# Patient Record
Sex: Female | Born: 1945 | Race: White | Hispanic: No | Marital: Married | Smoking: Never smoker
Health system: Southern US, Community
[De-identification: ages and names within clinical notes are randomized; demographics above are authoritative.]

## PROBLEM LIST (undated history)

## (undated) DIAGNOSIS — K219 Gastro-esophageal reflux disease without esophagitis: Secondary | ICD-10-CM

## (undated) DIAGNOSIS — I10 Essential (primary) hypertension: Secondary | ICD-10-CM

## (undated) DIAGNOSIS — M1990 Unspecified osteoarthritis, unspecified site: Secondary | ICD-10-CM

## (undated) DIAGNOSIS — Z8719 Personal history of other diseases of the digestive system: Secondary | ICD-10-CM

## (undated) HISTORY — PX: EYE SURGERY: SHX253

## (undated) HISTORY — PX: COLONOSCOPY: SHX174

## (undated) HISTORY — PX: ABDOMINAL HYSTERECTOMY: SHX81

## (undated) HISTORY — PX: ESOPHAGOGASTRODUODENOSCOPY: SHX1529

---

## 2004-04-02 ENCOUNTER — Ambulatory Visit: Payer: Self-pay | Admitting: Internal Medicine

## 2005-04-21 ENCOUNTER — Ambulatory Visit: Payer: Self-pay | Admitting: Internal Medicine

## 2005-11-25 ENCOUNTER — Ambulatory Visit: Payer: Self-pay | Admitting: Gastroenterology

## 2006-04-26 ENCOUNTER — Ambulatory Visit: Payer: Self-pay | Admitting: Internal Medicine

## 2007-05-04 ENCOUNTER — Ambulatory Visit: Payer: Self-pay | Admitting: Internal Medicine

## 2008-05-06 ENCOUNTER — Ambulatory Visit: Payer: Self-pay | Admitting: Internal Medicine

## 2009-05-08 ENCOUNTER — Ambulatory Visit: Payer: Self-pay | Admitting: Internal Medicine

## 2009-05-19 ENCOUNTER — Ambulatory Visit: Payer: Self-pay | Admitting: Internal Medicine

## 2010-05-20 ENCOUNTER — Ambulatory Visit: Payer: Self-pay | Admitting: Internal Medicine

## 2011-06-01 ENCOUNTER — Ambulatory Visit: Payer: Self-pay | Admitting: Internal Medicine

## 2012-07-19 ENCOUNTER — Ambulatory Visit: Payer: Self-pay | Admitting: Internal Medicine

## 2013-07-30 ENCOUNTER — Ambulatory Visit: Payer: Self-pay | Admitting: Family Medicine

## 2014-08-08 ENCOUNTER — Ambulatory Visit: Payer: Self-pay | Admitting: Family Medicine

## 2015-09-12 ENCOUNTER — Other Ambulatory Visit: Payer: Self-pay | Admitting: Family Medicine

## 2015-09-12 DIAGNOSIS — Z1231 Encounter for screening mammogram for malignant neoplasm of breast: Secondary | ICD-10-CM

## 2015-09-29 ENCOUNTER — Other Ambulatory Visit: Payer: Self-pay | Admitting: Family Medicine

## 2015-09-29 ENCOUNTER — Ambulatory Visit
Admission: RE | Admit: 2015-09-29 | Discharge: 2015-09-29 | Disposition: A | Discharge status: Home / Self Care | Payer: Medicare Other | Source: Ambulatory Visit | Attending: Family Medicine | Admitting: Family Medicine

## 2015-09-29 DIAGNOSIS — Z1231 Encounter for screening mammogram for malignant neoplasm of breast: Secondary | ICD-10-CM | POA: Insufficient documentation

## 2015-10-01 ENCOUNTER — Other Ambulatory Visit: Payer: Self-pay | Admitting: Family Medicine

## 2015-10-01 DIAGNOSIS — R928 Other abnormal and inconclusive findings on diagnostic imaging of breast: Secondary | ICD-10-CM

## 2015-10-17 ENCOUNTER — Ambulatory Visit
Admission: RE | Admit: 2015-10-17 | Discharge: 2015-10-17 | Disposition: A | Discharge status: Home / Self Care | Payer: Medicare Other | Source: Ambulatory Visit | Attending: Family Medicine | Admitting: Family Medicine

## 2015-10-17 DIAGNOSIS — N6001 Solitary cyst of right breast: Secondary | ICD-10-CM | POA: Insufficient documentation

## 2015-10-17 DIAGNOSIS — N63 Unspecified lump in breast: Secondary | ICD-10-CM | POA: Diagnosis present

## 2015-10-17 DIAGNOSIS — R928 Other abnormal and inconclusive findings on diagnostic imaging of breast: Secondary | ICD-10-CM

## 2016-08-30 ENCOUNTER — Encounter: Payer: Self-pay | Admitting: *Deleted

## 2016-08-31 ENCOUNTER — Ambulatory Visit: Payer: Medicare Other | Admitting: *Deleted

## 2016-08-31 ENCOUNTER — Ambulatory Visit
Admission: RE | Admit: 2016-08-31 | Discharge: 2016-08-31 | Disposition: A | Discharge status: Home / Self Care | Payer: Medicare Other | Source: Ambulatory Visit | Attending: Gastroenterology | Admitting: Gastroenterology

## 2016-08-31 ENCOUNTER — Encounter: Payer: Self-pay | Admitting: *Deleted

## 2016-08-31 ENCOUNTER — Encounter
Admission: RE | Disposition: A | Discharge status: Home / Self Care | Payer: Self-pay | Source: Ambulatory Visit | Attending: Gastroenterology

## 2016-08-31 DIAGNOSIS — Z1211 Encounter for screening for malignant neoplasm of colon: Secondary | ICD-10-CM | POA: Insufficient documentation

## 2016-08-31 DIAGNOSIS — Z79899 Other long term (current) drug therapy: Secondary | ICD-10-CM | POA: Insufficient documentation

## 2016-08-31 DIAGNOSIS — I10 Essential (primary) hypertension: Secondary | ICD-10-CM | POA: Diagnosis not present

## 2016-08-31 DIAGNOSIS — Z7982 Long term (current) use of aspirin: Secondary | ICD-10-CM | POA: Insufficient documentation

## 2016-08-31 DIAGNOSIS — K219 Gastro-esophageal reflux disease without esophagitis: Secondary | ICD-10-CM | POA: Diagnosis not present

## 2016-08-31 DIAGNOSIS — K5730 Diverticulosis of large intestine without perforation or abscess without bleeding: Secondary | ICD-10-CM | POA: Insufficient documentation

## 2016-08-31 DIAGNOSIS — K449 Diaphragmatic hernia without obstruction or gangrene: Secondary | ICD-10-CM | POA: Diagnosis not present

## 2016-08-31 DIAGNOSIS — M1810 Unilateral primary osteoarthritis of first carpometacarpal joint, unspecified hand: Secondary | ICD-10-CM | POA: Insufficient documentation

## 2016-08-31 HISTORY — DX: Gastro-esophageal reflux disease without esophagitis: K21.9

## 2016-08-31 HISTORY — DX: Personal history of other diseases of the digestive system: Z87.19

## 2016-08-31 HISTORY — DX: Essential (primary) hypertension: I10

## 2016-08-31 HISTORY — PX: COLONOSCOPY WITH PROPOFOL: SHX5780

## 2016-08-31 HISTORY — DX: Unspecified osteoarthritis, unspecified site: M19.90

## 2016-08-31 SURGERY — COLONOSCOPY WITH PROPOFOL
Anesthesia: General

## 2016-08-31 MED ORDER — PROPOFOL 500 MG/50ML IV EMUL
INTRAVENOUS | Status: DC | PRN
  Administered 2016-08-31: 50 ug/kg/min via INTRAVENOUS

## 2016-08-31 MED ORDER — SODIUM CHLORIDE 0.9 % IV SOLN
INTRAVENOUS | Status: DC
  Administered 2016-08-31: 1000 mL via INTRAVENOUS

## 2016-08-31 MED ORDER — SODIUM CHLORIDE 0.9 % IV SOLN: INTRAVENOUS | Status: DC

## 2016-08-31 MED ORDER — PROPOFOL 500 MG/50ML IV EMUL
INTRAVENOUS | Status: AC
  Filled 2016-08-31: qty 50

## 2016-08-31 NOTE — Anesthesia Post-op Follow-up Note (Cosign Needed)
Anesthesia QCDR form completed.        

## 2016-08-31 NOTE — H&P (Signed)
Outpatient short stay form Pre-procedure 08/31/2016 8:55 AM Lollie Sails MD  Primary Physician: Dr Derinda Late  Reason for visit:  Colonoscopy  History of present illness:  Patient is a 71 year old female presenting today as above. Her last colonoscopy was about 10 years ago. She tolerated her prep well. She takes no blood thinning agents. She does take an 81 mg aspirin but has held that this morning. She denies any other aspirin products. She does have some issues with firm stools and we have discussed use of products such as MiraLAX or Citrucel.    Current Facility-Administered Medications:  .  0.9 %  sodium chloride infusion, , Intravenous, Continuous, Lollie Sails, MD, Last Rate: 20 mL/hr at 08/31/16 0851, 1,000 mL at 08/31/16 0851 .  0.9 %  sodium chloride infusion, , Intravenous, Continuous, Lollie Sails, MD  Prescriptions Prior to Admission  Medication Sig Dispense Refill Last Dose  . aspirin EC 81 MG tablet Take 81 mg by mouth daily.     . calcium carbonate (OSCAL) 1500 (600 Ca) MG TABS tablet Take 1,500 mg by mouth 2 (two) times daily with a meal.     . losartan-hydrochlorothiazide (HYZAAR) 50-12.5 MG tablet Take 1 tablet by mouth daily.   08/31/2016 at 0300  . meloxicam (MOBIC) 7.5 MG tablet Take 7.5 mg by mouth daily.     . Misc Natural Products (GLUCOSAMINE-CHONDROITIN SULF) TABS Take by mouth daily.     Marland Kitchen omeprazole (PRILOSEC) 20 MG capsule Take 20 mg by mouth daily.     Marland Kitchen oxybutynin (DITROPAN) 5 MG tablet Take 5 mg by mouth 3 (three) times daily.     . polyethylene glycol powder (GLYCOLAX/MIRALAX) powder Take 1 Container by mouth once.     . triamcinolone (NASACORT AQ) 55 MCG/ACT AERO nasal inhaler Place 2 sprays into the nose daily.        Not on File   Past Medical History:  Diagnosis Date  . Arthritis    OSTEOARTHRITIS OF THUMB  . GERD (gastroesophageal reflux disease)   . History of hiatal hernia   . Hypertension     Review of systems:       Physical Exam    Heart and lungs: Regular rate and rhythm without rub or gallop, lungs are bilaterally clear.    HEENT: Normocephalic atraumatic eyes are anicteric    Other:     Pertinant exam for procedure: Soft nontender nondistended bowel sounds positive normoactive.    Planned proceedures: Colonoscopy and indicated procedures. I have discussed the risks benefits and complications of procedures to include not limited to bleeding, infection, perforation and the risk of sedation and the patient wishes to proceed.  Lollie Sails, MD Gastroenterology

## 2016-08-31 NOTE — Anesthesia Postprocedure Evaluation (Signed)
Anesthesia Post Note  Patient: [name]  Procedure(s) Performed: Procedure(s) (LRB): COLONOSCOPY WITH PROPOFOL (N/A)  Patient location during evaluation: Endoscopy Anesthesia Type: General Level of consciousness: awake and alert and oriented Pain management: pain level controlled Vital Signs Assessment: post-procedure vital signs reviewed and stable Respiratory status: spontaneous breathing, nonlabored ventilation and respiratory function stable Cardiovascular status: blood pressure returned to baseline and stable Postop Assessment: no signs of nausea or vomiting Anesthetic complications: no     Last Vitals:  Vitals:   08/31/16 0947 08/31/16 0957  BP: 109/68 126/71  Pulse: 60 (!) 55  Resp: 17 11  Temp:      Last Pain:  Vitals:   08/31/16 0927  TempSrc: Tympanic                 Keeshia Sanderlin

## 2016-08-31 NOTE — Transfer of Care (Signed)
Immediate Anesthesia Transfer of Care Note  Patient: Taylor Acevedo  Procedure(s) Performed: Procedure(s): COLONOSCOPY WITH PROPOFOL (N/A)  Patient Location: PACU  Anesthesia Type:General  Level of Consciousness: awake, alert  and oriented  Airway & Oxygen Therapy: Patient Spontanous Breathing and Patient connected to nasal cannula oxygen  Post-op Assessment: Report given to RN and Post -op Vital signs reviewed and stable  Post vital signs: Reviewed and stable  Last Vitals:  Vitals:   08/31/16 0835  BP: 125/68  Pulse: 68  Resp: 18  Temp: (!) 35.9 C    Last Pain: There were no vitals filed for this visit.       Complications: No apparent anesthesia complications

## 2016-08-31 NOTE — Anesthesia Preprocedure Evaluation (Signed)
Anesthesia Evaluation  Patient identified by MRN, date of birth, ID band Patient awake    Reviewed: Allergy & Precautions, NPO status , Patient's Chart, lab work & pertinent test results  History of Anesthesia Complications Negative for: history of anesthetic complications  Airway Mallampati: II  TM Distance: >3 FB Neck ROM: Full    Dental no notable dental hx.    Pulmonary neg pulmonary ROS, neg sleep apnea, neg COPD,    breath sounds clear to auscultation- rhonchi (-) wheezing      Cardiovascular hypertension, Pt. on medications (-) CAD and (-) Past MI  Rhythm:Regular Rate:Normal - Systolic murmurs and - Diastolic murmurs    Neuro/Psych negative neurological ROS  negative psych ROS   GI/Hepatic Neg liver ROS, hiatal hernia, GERD  ,  Endo/Other  negative endocrine ROSneg diabetes  Renal/GU negative Renal ROS     Musculoskeletal  (+) Arthritis ,   Abdominal (+) - obese,   Peds  Hematology negative hematology ROS (+)   Anesthesia Other Findings Past Medical History: No date: Arthritis     Comment: OSTEOARTHRITIS OF THUMB No date: GERD (gastroesophageal reflux disease) No date: History of hiatal hernia No date: Hypertension   Reproductive/Obstetrics                             Anesthesia Physical Anesthesia Plan  ASA: II  Anesthesia Plan: General   Post-op Pain Management:    Induction: Intravenous  Airway Management Planned: Natural Airway  Additional Equipment:   Intra-op Plan:   Post-operative Plan:   Informed Consent: I have reviewed the patients History and Physical, chart, labs and discussed the procedure including the risks, benefits and alternatives for the proposed anesthesia with the patient or authorized representative who has indicated his/her understanding and acceptance.   Dental advisory given  Plan Discussed with: CRNA and Anesthesiologist  Anesthesia  Plan Comments:         Anesthesia Quick Evaluation

## 2016-08-31 NOTE — Op Note (Signed)
[facility] Gastroenterology Patient Name: Taylor Acevedo Procedure Date: 08/31/2016 8:57 AM MRN: [id number] Account #: [id number] Date of Birth: [birth year] Admit Type: Outpatient Age: 70 Room: [facility] ENDO ROOM 3 Gender: Female Note Status: Finalized Procedure:            Colonoscopy Indications:          Screening for colorectal malignant neoplasm Providers:            Lollie Sails, MD Referring MD:         Caprice Renshaw MD (Referring MD) Medicines:            Monitored Anesthesia Care Complications:        No immediate complications. Procedure:            Pre-Anesthesia Assessment:                       - ASA Grade Assessment: II - A patient with mild                        systemic disease.                       After obtaining informed consent, the colonoscope was                        passed under direct vision. Throughout the procedure,                        the patient's blood pressure, pulse, and oxygen                        saturations were monitored continuously. The                        Colonoscope was introduced through the anus and                        advanced to the the cecum, identified by appendiceal                        orifice and ileocecal valve. The patient tolerated the                        procedure well. The quality of the bowel preparation                        was good. Findings:      A few small-mouthed diverticula were found in the sigmoid colon.      The digital rectal exam was normal.      The exam was otherwise without abnormality. Impression:           - Diverticulosis in the sigmoid colon.                       - The examination was otherwise normal.                       - No specimens collected. Recommendation:       - Discharge patient to home. Procedure Code(s):    --- Professional ---  45378, Colonoscopy, flexible; diagnostic, including                        collection of  specimen(s) by brushing or washing, when                        performed (separate procedure) Diagnosis Code(s):    --- Professional ---                       Z12.11, Encounter for screening for malignant neoplasm                        of colon                       K57.30, Diverticulosis of large intestine without                        perforation or abscess without bleeding CPT copyright 2016 American [employer]. All rights reserved. The codes documented in this report are preliminary and upon coder review may  be revised to meet current compliance requirements. Lollie Sails, MD 08/31/2016 9:29:20 AM This report has been signed electronically. Number of Addenda: 0 Note Initiated On: 08/31/2016 8:57 AM Scope Withdrawal Time: 0 hours 6 minutes 16 seconds  Total Procedure Duration: 0 hours 17 minutes 59 seconds       [facility]

## 2017-01-25 ENCOUNTER — Other Ambulatory Visit: Payer: Self-pay | Admitting: Family Medicine

## 2017-01-25 DIAGNOSIS — Z1231 Encounter for screening mammogram for malignant neoplasm of breast: Secondary | ICD-10-CM

## 2017-02-02 ENCOUNTER — Ambulatory Visit
Admission: RE | Admit: 2017-02-02 | Discharge: 2017-02-02 | Disposition: A | Discharge status: Home / Self Care | Payer: Medicare Other | Source: Ambulatory Visit | Attending: Family Medicine | Admitting: Family Medicine

## 2017-02-02 DIAGNOSIS — Z1231 Encounter for screening mammogram for malignant neoplasm of breast: Secondary | ICD-10-CM

## 2017-02-08 ENCOUNTER — Other Ambulatory Visit: Payer: Self-pay | Admitting: Family Medicine

## 2017-02-08 DIAGNOSIS — R928 Other abnormal and inconclusive findings on diagnostic imaging of breast: Secondary | ICD-10-CM

## 2017-02-08 DIAGNOSIS — N6310 Unspecified lump in the right breast, unspecified quadrant: Secondary | ICD-10-CM

## 2017-02-15 ENCOUNTER — Ambulatory Visit
Admission: RE | Admit: 2017-02-15 | Discharge: 2017-02-15 | Disposition: A | Discharge status: Home / Self Care | Payer: Medicare Other | Source: Ambulatory Visit | Attending: Family Medicine | Admitting: Family Medicine

## 2017-02-15 DIAGNOSIS — R928 Other abnormal and inconclusive findings on diagnostic imaging of breast: Secondary | ICD-10-CM

## 2017-02-15 DIAGNOSIS — N6310 Unspecified lump in the right breast, unspecified quadrant: Secondary | ICD-10-CM

## 2017-02-15 DIAGNOSIS — N6312 Unspecified lump in the right breast, upper inner quadrant: Secondary | ICD-10-CM | POA: Diagnosis not present

## 2017-07-25 ENCOUNTER — Other Ambulatory Visit: Payer: Self-pay | Admitting: Family Medicine

## 2017-07-25 DIAGNOSIS — N6310 Unspecified lump in the right breast, unspecified quadrant: Secondary | ICD-10-CM

## 2017-08-16 ENCOUNTER — Other Ambulatory Visit: Payer: Medicare Other

## 2017-08-26 ENCOUNTER — Ambulatory Visit
Admission: RE | Admit: 2017-08-26 | Discharge: 2017-08-26 | Disposition: A | Discharge status: Home / Self Care | Payer: Medicare Other | Source: Ambulatory Visit | Attending: Family Medicine | Admitting: Family Medicine

## 2017-08-26 DIAGNOSIS — N6314 Unspecified lump in the right breast, lower inner quadrant: Secondary | ICD-10-CM | POA: Insufficient documentation

## 2017-08-26 DIAGNOSIS — N6313 Unspecified lump in the right breast, lower outer quadrant: Secondary | ICD-10-CM | POA: Insufficient documentation

## 2017-08-26 DIAGNOSIS — N6310 Unspecified lump in the right breast, unspecified quadrant: Secondary | ICD-10-CM | POA: Diagnosis present

## 2018-01-25 ENCOUNTER — Other Ambulatory Visit: Payer: Self-pay | Admitting: Family Medicine

## 2018-01-25 DIAGNOSIS — R928 Other abnormal and inconclusive findings on diagnostic imaging of breast: Secondary | ICD-10-CM

## 2018-02-27 ENCOUNTER — Ambulatory Visit
Admission: RE | Admit: 2018-02-27 | Discharge: 2018-02-27 | Disposition: A | Discharge status: Home / Self Care | Payer: Medicare Other | Source: Ambulatory Visit | Attending: Family Medicine | Admitting: Family Medicine

## 2018-02-27 DIAGNOSIS — R928 Other abnormal and inconclusive findings on diagnostic imaging of breast: Secondary | ICD-10-CM | POA: Diagnosis not present

## 2018-06-21 HISTORY — PX: EYE SURGERY: SHX253

## 2019-03-29 ENCOUNTER — Other Ambulatory Visit: Payer: Self-pay | Admitting: Family Medicine

## 2019-03-29 DIAGNOSIS — R928 Other abnormal and inconclusive findings on diagnostic imaging of breast: Secondary | ICD-10-CM

## 2019-03-30 ENCOUNTER — Other Ambulatory Visit: Payer: Self-pay | Admitting: Family Medicine

## 2019-03-30 DIAGNOSIS — R928 Other abnormal and inconclusive findings on diagnostic imaging of breast: Secondary | ICD-10-CM

## 2019-04-24 ENCOUNTER — Ambulatory Visit
Admission: RE | Admit: 2019-04-24 | Discharge: 2019-04-24 | Disposition: A | Discharge status: Home / Self Care | Payer: Medicare Other | Source: Ambulatory Visit | Attending: Family Medicine | Admitting: Family Medicine

## 2019-04-24 DIAGNOSIS — R928 Other abnormal and inconclusive findings on diagnostic imaging of breast: Secondary | ICD-10-CM

## 2020-02-28 ENCOUNTER — Other Ambulatory Visit: Payer: Self-pay | Admitting: Orthopedic Surgery

## 2020-02-28 DIAGNOSIS — M25561 Pain in right knee: Secondary | ICD-10-CM

## 2020-03-02 ENCOUNTER — Other Ambulatory Visit: Payer: Self-pay

## 2020-03-02 ENCOUNTER — Ambulatory Visit
Admission: RE | Admit: 2020-03-02 | Discharge: 2020-03-02 | Disposition: A | Discharge status: Home / Self Care | Payer: Medicare Other | Source: Ambulatory Visit | Attending: Orthopedic Surgery | Admitting: Orthopedic Surgery

## 2020-03-02 DIAGNOSIS — M25561 Pain in right knee: Secondary | ICD-10-CM | POA: Insufficient documentation

## 2020-05-21 ENCOUNTER — Other Ambulatory Visit: Payer: Self-pay | Admitting: Family Medicine

## 2020-05-21 DIAGNOSIS — Z1231 Encounter for screening mammogram for malignant neoplasm of breast: Secondary | ICD-10-CM

## 2020-05-26 ENCOUNTER — Other Ambulatory Visit: Payer: Self-pay

## 2020-05-26 ENCOUNTER — Ambulatory Visit
Admission: RE | Admit: 2020-05-26 | Discharge: 2020-05-26 | Disposition: A | Discharge status: Home / Self Care | Payer: Medicare Other | Source: Ambulatory Visit | Attending: Family Medicine | Admitting: Family Medicine

## 2020-05-26 DIAGNOSIS — Z1231 Encounter for screening mammogram for malignant neoplasm of breast: Secondary | ICD-10-CM | POA: Diagnosis present

## 2020-05-28 ENCOUNTER — Other Ambulatory Visit (HOSPITAL_COMMUNITY): Payer: Self-pay | Admitting: Orthopedic Surgery

## 2020-05-28 ENCOUNTER — Other Ambulatory Visit: Payer: Self-pay | Admitting: Orthopedic Surgery

## 2020-05-28 DIAGNOSIS — R29898 Other symptoms and signs involving the musculoskeletal system: Secondary | ICD-10-CM

## 2020-05-28 DIAGNOSIS — S46011A Strain of muscle(s) and tendon(s) of the rotator cuff of right shoulder, initial encounter: Secondary | ICD-10-CM

## 2020-06-06 ENCOUNTER — Ambulatory Visit
Admission: RE | Admit: 2020-06-06 | Discharge: 2020-06-06 | Disposition: A | Discharge status: Home / Self Care | Payer: Medicare Other | Source: Ambulatory Visit | Attending: Orthopedic Surgery | Admitting: Orthopedic Surgery

## 2020-06-06 ENCOUNTER — Other Ambulatory Visit: Payer: Self-pay

## 2020-06-06 DIAGNOSIS — R29898 Other symptoms and signs involving the musculoskeletal system: Secondary | ICD-10-CM

## 2020-06-06 DIAGNOSIS — S46011A Strain of muscle(s) and tendon(s) of the rotator cuff of right shoulder, initial encounter: Secondary | ICD-10-CM | POA: Diagnosis present

## 2020-09-08 ENCOUNTER — Other Ambulatory Visit: Payer: Self-pay | Admitting: Orthopedic Surgery

## 2020-09-23 ENCOUNTER — Other Ambulatory Visit: Payer: Self-pay

## 2020-09-23 ENCOUNTER — Other Ambulatory Visit
Admission: RE | Admit: 2020-09-23 | Discharge: 2020-09-23 | Disposition: A | Discharge status: Home / Self Care | Payer: Medicare Other | Source: Ambulatory Visit | Attending: Orthopedic Surgery | Admitting: Orthopedic Surgery

## 2020-09-23 NOTE — Patient Instructions (Signed)
Your procedure is scheduled on: Tuesday September 30, 2020. Report to Day Surgery inside [place] 2nd floor (Stop by admissions desk first before getting on elevator). To find out your arrival time please call [phone] between 1PM - 3PM on Monday September 29, 2020.  Remember: Instructions that are not followed completely may result in serious medical risk,  up to and including death, or upon the discretion of your surgeon and anesthesiologist your  surgery may need to be rescheduled.     _X__ 1. Do not eat food after midnight the night before your procedure.                 No chewing gum or hard candies. You may drink clear liquids up to 2 hours                 before you are scheduled to arrive for your surgery- DO not drink clear                 liquids within 2 hours of the start of your surgery.                 Clear Liquids include:  water, apple juice without pulp, clear Gatorade, G2 or                  Gatorade Zero (avoid Red/Purple/Blue), Black Coffee or Tea (Do not add                 anything to coffee or tea).  __X__2.   Complete the "Ensure Clear Pre-surgery Clear Carbohydrate Drink" provided to you, 2 hours before arrival. **If you are diabetic you will be provided with an alternative drink, Gatorade Zero or G2.  __X__3.  On the morning of surgery brush your teeth with toothpaste and water, you                may rinse your mouth with mouthwash if you wish.  Do not swallow any toothpaste of mouthwash.     _X__ 4.  No Alcohol for 24 hours before or after surgery.   _X__ 5.  Do Not Smoke or use e-cigarettes For 24 Hours Prior to Your Surgery.                 Do not use any chewable tobacco products for at least 6 hours prior to                 Surgery.  _X__ 6.  Do not use any recreational drugs (marijuana, cocaine, heroin, ecstasy, MDMA or other)                For at least one week prior to your surgery.  Combination of these drugs with  anesthesia                May have life threatening results..   __X_7.  Notify your doctor if there is any change in your medical condition      (cold, fever, infections).     Do not wear jewelry, make-up, hairpins, clips or nail polish. Do not wear lotions, powders, or perfumes. You may wear deodorant. Do not shave 48 hours prior to surgery.  Do not bring valuables to the hospital.    [facility] is not responsible for any belongings or valuables.  Contacts, dentures or bridgework may not be worn into surgery. Leave your suitcase in the car. After surgery it may be brought to your  room. For patients admitted to the hospital, discharge time is determined by your treatment team.   Patients discharged the day of surgery will not be allowed to drive home.   Make arrangements for someone to be with you for the first 24 hours of your Same Day Discharge.   __X__ Take these medicines the morning of surgery with A SIP OF WATER:    1. omeprazole (PRILOSEC) 20 MG   2. acetaminophen (TYLENOL) 650 MG   3.   4.  5.  6.  ____ Fleet Enema (as directed)   __X__ Use CHG Soap (or wipes) as directed  ____ Use Benzoyl Peroxide Gel as instructed  ____ Use inhalers on the day of surgery  ____ Stop metformin 2 days prior to surgery    ____ Take 1/2 of usual insulin dose the night before surgery. No insulin the morning          of surgery.   __X__ Stop aspirin EC 81 MG as instructed.   __X__ Stop Anti-inflammatories such as etodolac (LODINE), Ibuprofen, Aleve, Advil, Motrin, naproxen and or BC powders.    __X__ Stop supplements until after surgery.    __X__Do not start any herbal supplements before your procedure.    If you have any questions regarding your pre-procedure instructions,  Please call Pre-admit Testing at [phone].

## 2020-09-26 ENCOUNTER — Other Ambulatory Visit: Payer: Medicare Other | Attending: Orthopedic Surgery

## 2020-09-26 ENCOUNTER — Other Ambulatory Visit
Admission: RE | Admit: 2020-09-26 | Discharge: 2020-09-26 | Disposition: A | Discharge status: Home / Self Care | Payer: Medicare Other | Source: Ambulatory Visit | Attending: Orthopedic Surgery | Admitting: Orthopedic Surgery

## 2020-09-26 ENCOUNTER — Other Ambulatory Visit: Payer: Self-pay

## 2020-09-26 DIAGNOSIS — I10 Essential (primary) hypertension: Secondary | ICD-10-CM | POA: Diagnosis not present

## 2020-09-26 DIAGNOSIS — Z01818 Encounter for other preprocedural examination: Secondary | ICD-10-CM | POA: Diagnosis not present

## 2020-09-26 DIAGNOSIS — Z20822 Contact with and (suspected) exposure to covid-19: Secondary | ICD-10-CM | POA: Diagnosis not present

## 2020-09-26 LAB — SARS CORONAVIRUS 2 (TAT 6-24 HRS): SARS Coronavirus 2: NEGATIVE

## 2020-09-30 ENCOUNTER — Ambulatory Visit: Payer: Medicare Other | Admitting: Anesthesiology

## 2020-09-30 ENCOUNTER — Encounter: Payer: Self-pay | Admitting: Orthopedic Surgery

## 2020-09-30 ENCOUNTER — Ambulatory Visit
Admission: RE | Admit: 2020-09-30 | Discharge: 2020-09-30 | Disposition: A | Discharge status: Home / Self Care | Payer: Medicare Other | Attending: Orthopedic Surgery | Admitting: Orthopedic Surgery

## 2020-09-30 ENCOUNTER — Encounter
Admission: RE | Disposition: A | Discharge status: Home / Self Care | Payer: Self-pay | Source: Home / Self Care | Attending: Orthopedic Surgery

## 2020-09-30 DIAGNOSIS — Z7982 Long term (current) use of aspirin: Secondary | ICD-10-CM | POA: Insufficient documentation

## 2020-09-30 DIAGNOSIS — S83012A Lateral subluxation of left patella, initial encounter: Secondary | ICD-10-CM | POA: Insufficient documentation

## 2020-09-30 DIAGNOSIS — X58XXXA Exposure to other specified factors, initial encounter: Secondary | ICD-10-CM | POA: Diagnosis not present

## 2020-09-30 DIAGNOSIS — Z888 Allergy status to other drugs, medicaments and biological substances status: Secondary | ICD-10-CM | POA: Insufficient documentation

## 2020-09-30 DIAGNOSIS — M23241 Derangement of anterior horn of lateral meniscus due to old tear or injury, right knee: Secondary | ICD-10-CM | POA: Diagnosis not present

## 2020-09-30 DIAGNOSIS — M1711 Unilateral primary osteoarthritis, right knee: Secondary | ICD-10-CM | POA: Insufficient documentation

## 2020-09-30 DIAGNOSIS — M6751 Plica syndrome, right knee: Secondary | ICD-10-CM | POA: Insufficient documentation

## 2020-09-30 DIAGNOSIS — Z791 Long term (current) use of non-steroidal anti-inflammatories (NSAID): Secondary | ICD-10-CM | POA: Insufficient documentation

## 2020-09-30 DIAGNOSIS — Z79899 Other long term (current) drug therapy: Secondary | ICD-10-CM | POA: Diagnosis not present

## 2020-09-30 HISTORY — PX: KNEE ARTHROSCOPY WITH LATERAL MENISECTOMY: SHX6193

## 2020-09-30 SURGERY — ARTHROSCOPY, KNEE, WITH LATERAL MENISCECTOMY
Anesthesia: General | Site: Knee | Laterality: Right

## 2020-09-30 MED ORDER — CHLORHEXIDINE GLUCONATE 0.12 % MT SOLN
OROMUCOSAL | Status: AC
  Administered 2020-09-30: 15 mL via OROMUCOSAL
  Filled 2020-09-30: qty 15

## 2020-09-30 MED ORDER — DEXAMETHASONE SODIUM PHOSPHATE 10 MG/ML IJ SOLN
INTRAMUSCULAR | Status: DC | PRN
  Administered 2020-09-30: 10 mg via INTRAVENOUS

## 2020-09-30 MED ORDER — ORAL CARE MOUTH RINSE: 15.0000 mL | Freq: Once | OROMUCOSAL | Status: AC

## 2020-09-30 MED ORDER — FENTANYL CITRATE (PF) 100 MCG/2ML IJ SOLN
INTRAMUSCULAR | Status: DC | PRN
  Administered 2020-09-30: 12.5 ug via INTRAVENOUS
  Administered 2020-09-30: 25 ug via INTRAVENOUS

## 2020-09-30 MED ORDER — HYDROCODONE-ACETAMINOPHEN 5-325 MG PO TABS: 1.0000 | ORAL_TABLET | Freq: Four times a day (QID) | ORAL | 0 refills | Status: AC | PRN

## 2020-09-30 MED ORDER — ONDANSETRON HCL 4 MG/2ML IJ SOLN: 4.0000 mg | Freq: Four times a day (QID) | INTRAMUSCULAR | Status: DC | PRN

## 2020-09-30 MED ORDER — SODIUM CHLORIDE 0.9 % IV SOLN: INTRAVENOUS | Status: DC

## 2020-09-30 MED ORDER — CEFAZOLIN SODIUM-DEXTROSE 2-4 GM/100ML-% IV SOLN
INTRAVENOUS | Status: AC
  Filled 2020-09-30: qty 100

## 2020-09-30 MED ORDER — ACETAMINOPHEN 10 MG/ML IV SOLN
INTRAVENOUS | Status: DC | PRN
  Administered 2020-09-30: 1000 mg via INTRAVENOUS

## 2020-09-30 MED ORDER — METOCLOPRAMIDE HCL 5 MG/ML IJ SOLN: 5.0000 mg | Freq: Three times a day (TID) | INTRAMUSCULAR | Status: DC | PRN

## 2020-09-30 MED ORDER — PROPOFOL 10 MG/ML IV BOLUS
INTRAVENOUS | Status: AC
  Filled 2020-09-30: qty 20

## 2020-09-30 MED ORDER — KETOROLAC TROMETHAMINE 30 MG/ML IJ SOLN
INTRAMUSCULAR | Status: DC | PRN
  Administered 2020-09-30: 15 mg via INTRAVENOUS

## 2020-09-30 MED ORDER — MIDAZOLAM HCL 2 MG/2ML IJ SOLN
INTRAMUSCULAR | Status: AC
  Filled 2020-09-30: qty 2

## 2020-09-30 MED ORDER — ONDANSETRON HCL 4 MG/2ML IJ SOLN
INTRAMUSCULAR | Status: DC | PRN
  Administered 2020-09-30: 4 mg via INTRAVENOUS

## 2020-09-30 MED ORDER — LIDOCAINE HCL (PF) 2 % IJ SOLN
INTRAMUSCULAR | Status: AC
  Filled 2020-09-30: qty 5

## 2020-09-30 MED ORDER — FENTANYL CITRATE (PF) 100 MCG/2ML IJ SOLN
INTRAMUSCULAR | Status: AC
  Administered 2020-09-30: 25 ug via INTRAVENOUS
  Filled 2020-09-30: qty 2

## 2020-09-30 MED ORDER — ONDANSETRON HCL 4 MG PO TABS: 4.0000 mg | ORAL_TABLET | Freq: Four times a day (QID) | ORAL | Status: DC | PRN

## 2020-09-30 MED ORDER — LIDOCAINE HCL (CARDIAC) PF 100 MG/5ML IV SOSY
PREFILLED_SYRINGE | INTRAVENOUS | Status: DC | PRN
  Administered 2020-09-30: 60 mg via INTRAVENOUS

## 2020-09-30 MED ORDER — METOCLOPRAMIDE HCL 10 MG PO TABS: 5.0000 mg | ORAL_TABLET | Freq: Three times a day (TID) | ORAL | Status: DC | PRN

## 2020-09-30 MED ORDER — FENTANYL CITRATE (PF) 100 MCG/2ML IJ SOLN
25.0000 ug | INTRAMUSCULAR | Status: DC | PRN
  Administered 2020-09-30 (×3): 25 ug via INTRAVENOUS

## 2020-09-30 MED ORDER — LACTATED RINGERS IV SOLN: INTRAVENOUS | Status: DC

## 2020-09-30 MED ORDER — CEFAZOLIN SODIUM-DEXTROSE 2-4 GM/100ML-% IV SOLN: 2.0000 g | INTRAVENOUS | Status: DC

## 2020-09-30 MED ORDER — EPHEDRINE 5 MG/ML INJ
INTRAVENOUS | Status: AC
  Filled 2020-09-30: qty 10

## 2020-09-30 MED ORDER — PROPOFOL 500 MG/50ML IV EMUL
INTRAVENOUS | Status: DC | PRN
  Administered 2020-09-30: 150 mg via INTRAVENOUS

## 2020-09-30 MED ORDER — MIDAZOLAM HCL 2 MG/2ML IJ SOLN
INTRAMUSCULAR | Status: DC | PRN
  Administered 2020-09-30: 1 mg via INTRAVENOUS

## 2020-09-30 MED ORDER — KETOROLAC TROMETHAMINE 30 MG/ML IJ SOLN
INTRAMUSCULAR | Status: AC
  Filled 2020-09-30: qty 1

## 2020-09-30 MED ORDER — BUPIVACAINE-EPINEPHRINE (PF) 0.5% -1:200000 IJ SOLN
INTRAMUSCULAR | Status: DC | PRN
  Administered 2020-09-30: 30 mL

## 2020-09-30 MED ORDER — FENTANYL CITRATE (PF) 100 MCG/2ML IJ SOLN
INTRAMUSCULAR | Status: AC
  Filled 2020-09-30: qty 2

## 2020-09-30 MED ORDER — CHLORHEXIDINE GLUCONATE 0.12 % MT SOLN: 15.0000 mL | Freq: Once | OROMUCOSAL | Status: AC

## 2020-09-30 MED ORDER — DEXAMETHASONE SODIUM PHOSPHATE 10 MG/ML IJ SOLN
INTRAMUSCULAR | Status: AC
  Filled 2020-09-30: qty 1

## 2020-09-30 MED ORDER — ONDANSETRON HCL 4 MG/2ML IJ SOLN
INTRAMUSCULAR | Status: AC
  Filled 2020-09-30: qty 2

## 2020-09-30 SURGICAL SUPPLY — 32 items
APL PRP STRL LF DISP 70% ISPRP (MISCELLANEOUS) ×1
BLADE INCISOR PLUS 4.5 (BLADE) IMPLANT
BNDG ELASTIC 4X5.8 VLCR STR LF (GAUZE/BANDAGES/DRESSINGS) IMPLANT
CHLORAPREP W/TINT 26 (MISCELLANEOUS) ×2 IMPLANT
COVER WAND RF STERILE (DRAPES) ×2 IMPLANT
CUFF TOURN SGL QUICK 24 (TOURNIQUET CUFF)
CUFF TOURN SGL QUICK 30 (TOURNIQUET CUFF)
CUFF TRNQT CYL 24X4X16.5-23 (TOURNIQUET CUFF) IMPLANT
CUFF TRNQT CYL 30X4X21-28X (TOURNIQUET CUFF) IMPLANT
DRAPE C-ARMOR (DRAPES) IMPLANT
GAUZE SPONGE 4X4 12PLY STRL (GAUZE/BANDAGES/DRESSINGS) ×2 IMPLANT
GLOVE SURG SYN 9.0  PF PI (GLOVE) ×1
GLOVE SURG SYN 9.0 PF PI (GLOVE) ×1 IMPLANT
GOWN SRG 2XL LVL 4 RGLN SLV (GOWNS) ×1 IMPLANT
GOWN STRL NON-REIN 2XL LVL4 (GOWNS) ×2
GOWN STRL REUS W/ TWL LRG LVL3 (GOWN DISPOSABLE) ×2 IMPLANT
GOWN STRL REUS W/TWL LRG LVL3 (GOWN DISPOSABLE) ×4
IV LACTATED RINGER IRRG 3000ML (IV SOLUTION) ×4
IV LR IRRIG 3000ML ARTHROMATIC (IV SOLUTION) ×2 IMPLANT
KIT TURNOVER KIT A (KITS) ×2 IMPLANT
MANIFOLD NEPTUNE II (INSTRUMENTS) ×4 IMPLANT
NEEDLE HYPO 22GX1.5 SAFETY (NEEDLE) ×2 IMPLANT
PACK ARTHROSCOPY KNEE (MISCELLANEOUS) ×2 IMPLANT
SCALPEL PROTECTED #11 DISP (BLADE) ×2 IMPLANT
SET TUBE SUCT SHAVER OUTFL 24K (TUBING) ×2 IMPLANT
SET TUBE TIP INTRA-ARTICULAR (MISCELLANEOUS) ×2 IMPLANT
SUT ETHILON 4-0 (SUTURE) ×2
SUT ETHILON 4-0 FS2 18XMFL BLK (SUTURE) ×1
SUTURE ETHLN 4-0 FS2 18XMF BLK (SUTURE) ×1 IMPLANT
TUBING ARTHRO INFLOW-ONLY STRL (TUBING) ×2 IMPLANT
WAND APOLLORF SJ50 AR-9845 (SURGICAL WAND) ×2 IMPLANT
WAND COBLATION FLOW 50 (SURGICAL WAND) ×2 IMPLANT

## 2020-09-30 NOTE — Anesthesia Procedure Notes (Signed)
Procedure Name: LMA Insertion Date/Time: 09/30/2020 12:59 PM Performed by: Joellyn Quails, RN Pre-anesthesia Checklist: Patient identified, Patient being monitored, Timeout performed, Emergency Drugs available and Suction available Patient Re-evaluated:Patient Re-evaluated prior to induction Oxygen Delivery Method: Circle system utilized Preoxygenation: Pre-oxygenation with 100% oxygen Induction Type: IV induction Ventilation: Mask ventilation without difficulty LMA: LMA inserted LMA Size: 4.0 Tube type: Oral Number of attempts: 1 Placement Confirmation: positive ETCO2 and breath sounds checked- equal and bilateral Tube secured with: Tape Dental Injury: Teeth and Oropharynx as per pre-operative assessment

## 2020-09-30 NOTE — Anesthesia Postprocedure Evaluation (Signed)
Anesthesia Post Note  Patient: Taylor Acevedo  Procedure(s) Performed: Rght knee arthroscopy with partial lateral meniscectomy, lateral release (Right Knee)  Patient location during evaluation: PACU Anesthesia Type: General Level of consciousness: awake and alert Pain management: pain level controlled Vital Signs Assessment: post-procedure vital signs reviewed and stable Respiratory status: spontaneous breathing, nonlabored ventilation, respiratory function stable and patient connected to nasal cannula oxygen Cardiovascular status: blood pressure returned to baseline and stable Postop Assessment: no apparent nausea or vomiting Anesthetic complications: no   No complications documented.   Last Vitals:  Vitals:   09/30/20 1446 09/30/20 1502  BP: (!) 148/69 (!) 154/70  Pulse: (!) 57 64  Resp: 15 14  Temp: (!) 36.1 C (!) 36.2 C  SpO2: 94% 95%    Last Pain:  Vitals:   09/30/20 1502  TempSrc: Tympanic  PainSc: [place]

## 2020-09-30 NOTE — Anesthesia Preprocedure Evaluation (Signed)
Anesthesia Evaluation  Patient identified by MRN, date of birth, ID band Patient awake    Reviewed: Allergy & Precautions, H&P , NPO status , Patient's Chart, lab work & pertinent test results  History of Anesthesia Complications Negative for: history of anesthetic complications  Airway Mallampati: III  TM Distance: >3 FB Neck ROM: full    Dental  (+) Chipped   Pulmonary neg pulmonary ROS, neg shortness of breath,    Pulmonary exam normal        Cardiovascular Exercise Tolerance: Good hypertension, (-) angina(-) Past MI and (-) DOE Normal cardiovascular exam     Neuro/Psych negative neurological ROS  negative psych ROS   GI/Hepatic Neg liver ROS, GERD  Medicated and Controlled,  Endo/Other  negative endocrine ROS  Renal/GU      Musculoskeletal   Abdominal   Peds  Hematology negative hematology ROS (+)   Anesthesia Other Findings Past Medical History: No date: Arthritis     Comment:  OSTEOARTHRITIS OF THUMB No date: GERD (gastroesophageal reflux disease) No date: History of hiatal hernia No date: Hypertension  Past Surgical History: No date: ABDOMINAL HYSTERECTOMY No date: COLONOSCOPY 08/31/2016: COLONOSCOPY WITH PROPOFOL; N/A     Comment:  Procedure: COLONOSCOPY WITH PROPOFOL;  Surgeon: Lollie Sails, MD;  Location: [facility] ENDOSCOPY;  Service:               Endoscopy;  Laterality: N/A; No date: ESOPHAGOGASTRODUODENOSCOPY 2020: EYE SURGERY; Bilateral     Comment:  Brow and Eyelid lifts   BMI    Body Mass Index: 29.10 kg/m      Reproductive/Obstetrics negative OB ROS                             Anesthesia Physical Anesthesia Plan  ASA: III  Anesthesia Plan: General LMA   Post-op Pain Management:    Induction: Intravenous  PONV Risk Score and Plan: Dexamethasone, Ondansetron, Midazolam and Treatment may vary due to age or medical condition  Airway  Management Planned: LMA  Additional Equipment:   Intra-op Plan:   Post-operative Plan: Extubation in OR  Informed Consent: I have reviewed the patients History and Physical, chart, labs and discussed the procedure including the risks, benefits and alternatives for the proposed anesthesia with the patient or authorized representative who has indicated his/her understanding and acceptance.     Dental Advisory Given  Plan Discussed with: Anesthesiologist, CRNA and Surgeon  Anesthesia Plan Comments: (Patient consented for risks of anesthesia including but not limited to:  - adverse reactions to medications - damage to eyes, teeth, lips or other oral mucosa - nerve damage due to positioning  - sore throat or hoarseness - Damage to heart, brain, nerves, lungs, other parts of body or loss of life  Patient voiced understanding.)        Anesthesia Quick Evaluation

## 2020-09-30 NOTE — Transfer of Care (Signed)
Immediate Anesthesia Transfer of Care Note  Patient: Taylor Acevedo  Procedure(s) Performed: Rght knee arthroscopy with partial lateral meniscectomy, lateral release (Right Knee)  Patient Location: PACU  Anesthesia Type:General  Level of Consciousness: sedated  Airway & Oxygen Therapy: Patient Spontanous Breathing and Patient connected to face mask oxygen  Post-op Assessment: Report given to RN and Post -op Vital signs reviewed and stable  Post vital signs: Reviewed and stable  Last Vitals:  Vitals Value Taken Time  BP 117/75 09/30/20 1345  Temp    Pulse 57 09/30/20 1347  Resp 14 09/30/20 1347  SpO2 99 % 09/30/20 1347  Vitals shown include unvalidated device data.  Last Pain:  Vitals:   09/30/20 1041  PainSc: 0-No pain         Complications: No complications documented.

## 2020-09-30 NOTE — Discharge Instructions (Addendum)
AMBULATORY SURGERY  DISCHARGE INSTRUCTIONS   1) The drugs that you were given will stay in your system until tomorrow so for the next 24 hours you should not:  A) Drive an automobile B) Make any legal decisions C) Drink any alcoholic beverage   2) You may resume regular meals tomorrow.  Today it is better to start with liquids and gradually work up to solid foods.  You may eat anything you prefer, but it is better to start with liquids, then soup and crackers, and gradually work up to solid foods.   3) Please notify your doctor immediately if you have any unusual bleeding, trouble breathing, redness and pain at the surgery site, drainage, fever, or pain not relieved by medication. 4)   5) Your post-operative visit with Dr.                                     is: Date:                        Time:    Please call to schedule your post-operative visit.  6) Additional Instructions:    Take it easy with minimal walking the next 3 days. Friday morning take off entire bandage and cover 2 incisions with Band-Aids. Okay to shower after that but changed and dry Band-Aids after showering. Resume all normal medications today Pain medicine as directed Call office if you are having problems.

## 2020-09-30 NOTE — Op Note (Signed)
09/30/2020  1:47 PM  PATIENT:  Taylor Acevedo  75 y.o. 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  BLOOD ADMINISTERED:none  DRAINS: none   LOCAL MEDICATIONS USED:  MARCAINE     SPECIMEN:  No Specimen  DISPOSITION OF SPECIMEN:  N/A  COUNTS:  YES  TOURNIQUET none  IMPLANTS: None  DICTATION: .Dragon Dictation patient was brought to the operating room and after adequate anesthesia was obtained the right leg was put prepped and draped in usual sterile fashion with a tourniquet applied the upper thigh.  After patient identification and timeout procedure were completed inferior lateral portal was made and the arthroscope introduced.  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  Going more medially there is a medial plica band which appear to impinge in the joint and was subsequently ablated.  The gutters were checked and there are no loose bodies.  Going to the medial compartment inferior medial portal was made and probe introduced there is approximately 1 x 2 cm area of the femoral condyle that had softening of the cartilage but no exposed bone and meniscus had 1 small fibrillar lesion that was ablated but was not really large enough to be called to tear.  ACL was normal going the lateral compartment  there is a complex tear of the middle and anterior thirds which was significant could be displaced in the joint.  This was ablated with the wand.  Pre and post procedure pictures obtained.  Following this the lateral release was carried out releasing the tight lateral retinaculum and getting the patella more in the midline with less pressure on the lateral facet of the patella and trochlea.  Following this the knee was irrigated until clear followed by infiltration of 30 cc half percent Sensorcaine and dressings of Xeroform 4 x 4 web roll and Ace wrap.  PLAN OF CARE: Discharge to home after PACU  PATIENT DISPOSITION:  PACU - hemodynamically stable.

## 2020-09-30 NOTE — H&P (Signed)
Chief Complaint  Patient presents with  . Pre-op Exam    History of the Present Illness: Taylor Acevedo is a 75 y.o. female here today for history and physical for right knee arthroscopy for lateral meniscectomy and lateral release on 09/30/2020 with Dr. Hessie Knows. Patient has a lateral meniscus tear with lateral patella subluxation. MRI confirmed anterior lateral meniscus tear with mild degenerative changes of the patellofemoral joint with lateral tracking of the patella.The patient states her right knee is still not straight. She has significant swelling in her right knee. She has pain with ambulation. She notes when she first came in her pain was in the medial right right, but now that has abated and her pain is to the anterior lateral knee. She has pain with flexion of the right knee. The patient states she has significant pain when she tries to stand up after sitting for a while. She denies any catching under her patella. The patient has a history of a meniscal tear on the right.   The patient has never had a blood clot. She takes glucosamine and a baby aspirin daily.   I have reviewed past medical, surgical, social and family history, and allergies as documented in the EMR.  Past Medical History: Past Medical History:  Diagnosis Date  . Arthritis  . Diverticulosis 08/31/2016  . GERD (gastroesophageal reflux disease)  . HH (hiatus hernia) 11/25/2005  . Hypertension   Past Surgical History: Past Surgical History:  Procedure Laterality Date  . CATARACT EXTRACTION 03/01/14  . COLONOSCOPY 11/25/2005  . COLONOSCOPY [id number]  Diverticulosis/The examination was otherwise normal/Repeat 25yrs/MUS  . EGD 11/25/2005  . HYSTERECTOMY  . KNEE ARTHROSCOPY Right  Rudene Christians 09/30/2020   Past Family History: Family History  Problem Relation Age of Onset  . Breast cancer Mother   Medications: Current Outpatient Medications Ordered in Epic  Medication Sig Dispense Refill  . aspirin 81 MG EC  tablet TAKE 1 TABLET (81 MG TOTAL) BY MOUTH ONCE DAILY. 90 tablet 3  . etodolac (LODINE) 500 MG tablet TAKE 1 TABLET BY MOUTH TWICE A DAY 30 tablet 0  . fluticasone propionate (FLONASE) 50 mcg/actuation nasal spray Place 1 spray into both nostrils once daily  . GLUCOSAM SUL NA/CHONDR SU A NA (GLUCOSAMINE & CHONDROIT SUL.NA ORAL) Take by mouth.  . losartan-hydrochlorothiazide (HYZAAR) 50-12.5 mg tablet TAKE 1 TABLET ONCE DAILY 90 tablet 1  . omeprazole (PRILOSEC OTC) 20 MG EC tablet Take 1 tablet (20 mg total) by mouth 2 (two) times daily as needed (Patient not taking: Reported on 08/20/2020 ) 180 tablet 3  . omeprazole (PRILOSEC) 20 MG DR capsule Take 1 capsule by mouth once daily   No current Epic-ordered facility-administered medications on file.   Allergies: Allergies  Allergen Reactions  . Polyglactin 370 Other (See Comments)  Polyglactin 910 suture; stitch abscesses    There is no height or weight on file to calculate BMI.  Review of Systems: A comprehensive 14 point ROS was performed, reviewed, and the pertinent orthopaedic findings are documented in the HPI.  There were no vitals filed for this visit.   General Physical Examination:   General:  Well developed, well nourished, no apparent distress, normal affect, normal gait with no antalgic component.   HEENT: Head normocephalic, atraumatic, PERRL.   Abdomen: Soft, non tender, non distended, Bowel sounds present.  Heart: Examination of the heart reveals regular, rate, and rhythm. There is no murmur noted on ascultation. There is a normal apical pulse.  Lungs:  Lungs are clear to auscultation. There is no wheeze, rhonchi, or crackles. There is normal expansion of bilateral chest walls.   Musculoskeletal Examination:  On exam, right knee flexion to 130 degrees. Tight lateral retinaculum on the right. Significant swelling of the right knee. No Baker cyst on the right. No instability. Pain to the medial aspect of the right  knee.   Radiographs: MRI confirmed anterior lateral meniscus tear with mild degenerative changes of the patellofemoral joint with lateral tracking of the patella.  Assessment: ICD-10-CM  1. 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 Primary osteoarthritis of right knee M17.11   Plan:  1. Patient with right knee pain, lateral tracking of the patella with lateral patella subluxation and lateral meniscus tear. Risks, benefits, complications of a right knee arthroscopy have been discussed with the patient. Patient has agreed and consented procedure. Patient has agreed and consented to a right knee arthroscopy with lateral meniscectomy and lateral release with Dr. Hessie Knows on 09/30/2020. I have set up outpatient physical therapy for her. We discussed postoperative course. The patient has clinical findings of right knee lateral meniscus tear with patellar subluxation.   Electronically signed by Feliberto Gottron, [place] at 09/22/2020 2:20 PM EDT  Reviewed  H+P. No changes noted.

## 2020-10-01 ENCOUNTER — Encounter: Payer: Self-pay | Admitting: Orthopedic Surgery

## 2021-05-25 ENCOUNTER — Other Ambulatory Visit: Payer: Self-pay | Admitting: Family Medicine

## 2021-05-25 DIAGNOSIS — Z1231 Encounter for screening mammogram for malignant neoplasm of breast: Secondary | ICD-10-CM

## 2021-05-27 ENCOUNTER — Other Ambulatory Visit: Payer: Self-pay

## 2021-05-27 ENCOUNTER — Ambulatory Visit
Admission: RE | Admit: 2021-05-27 | Discharge: 2021-05-27 | Disposition: A | Discharge status: Home / Self Care | Payer: Medicare Other | Source: Ambulatory Visit | Attending: Family Medicine | Admitting: Family Medicine

## 2021-05-27 DIAGNOSIS — Z1231 Encounter for screening mammogram for malignant neoplasm of breast: Secondary | ICD-10-CM | POA: Insufficient documentation

## 2021-06-02 ENCOUNTER — Other Ambulatory Visit: Payer: Self-pay | Admitting: Family Medicine

## 2021-06-02 DIAGNOSIS — R928 Other abnormal and inconclusive findings on diagnostic imaging of breast: Secondary | ICD-10-CM

## 2021-06-02 DIAGNOSIS — N6320 Unspecified lump in the left breast, unspecified quadrant: Secondary | ICD-10-CM

## 2021-06-11 ENCOUNTER — Ambulatory Visit
Admission: RE | Admit: 2021-06-11 | Discharge: 2021-06-11 | Disposition: A | Discharge status: Home / Self Care | Payer: Medicare Other | Source: Ambulatory Visit | Attending: Family Medicine | Admitting: Family Medicine

## 2021-06-11 ENCOUNTER — Other Ambulatory Visit: Payer: Self-pay

## 2021-06-11 DIAGNOSIS — N6320 Unspecified lump in the left breast, unspecified quadrant: Secondary | ICD-10-CM | POA: Insufficient documentation

## 2021-06-11 DIAGNOSIS — R928 Other abnormal and inconclusive findings on diagnostic imaging of breast: Secondary | ICD-10-CM | POA: Diagnosis not present

## 2021-07-13 DIAGNOSIS — H0015 Chalazion left lower eyelid: Secondary | ICD-10-CM | POA: Diagnosis not present

## 2021-07-13 DIAGNOSIS — L821 Other seborrheic keratosis: Secondary | ICD-10-CM | POA: Diagnosis not present

## 2021-07-13 DIAGNOSIS — H0014 Chalazion left upper eyelid: Secondary | ICD-10-CM | POA: Diagnosis not present

## 2021-07-21 DIAGNOSIS — I10 Essential (primary) hypertension: Secondary | ICD-10-CM | POA: Diagnosis not present

## 2021-07-21 DIAGNOSIS — R739 Hyperglycemia, unspecified: Secondary | ICD-10-CM | POA: Diagnosis not present

## 2021-07-21 DIAGNOSIS — Z79899 Other long term (current) drug therapy: Secondary | ICD-10-CM | POA: Diagnosis not present

## 2021-07-28 DIAGNOSIS — Z0000 Encounter for general adult medical examination without abnormal findings: Secondary | ICD-10-CM | POA: Diagnosis not present

## 2021-07-28 DIAGNOSIS — Z1331 Encounter for screening for depression: Secondary | ICD-10-CM | POA: Diagnosis not present

## 2021-08-03 DIAGNOSIS — E782 Mixed hyperlipidemia: Secondary | ICD-10-CM | POA: Diagnosis not present

## 2021-08-10 IMAGING — MR MR SHOULDER*R* W/O CM
4 of 5 series · 27 of 40 positions shown · non-contrast
Comparison: None.

CLINICAL DATA: Right shoulder and arm pain with limited range of
motion. A cabinet struck the patient's shoulder approximately 1
month ago.

EXAM:
MRI OF THE RIGHT SHOULDER WITHOUT CONTRAST
TECHNIQUE: Multiplanar, multisequence MR imaging of the shoulder was performed.
No intravenous contrast was administered.

[Series 5: T2 fat-sat · axial · right · 4.0mm · 0.44mm/px · z∈[-68,+52]mm · 8 of 26 slices shown (1 of 3)]
[im 1/26]
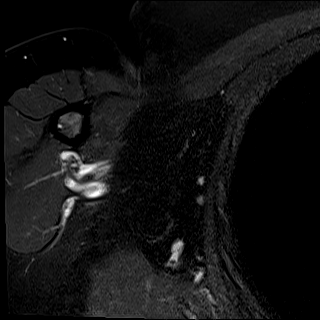
[im 3/26]
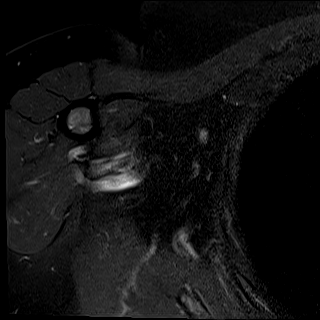
[im 9/26]
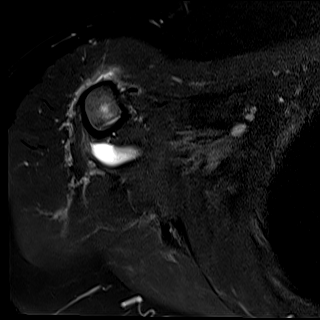
[im 12/26]
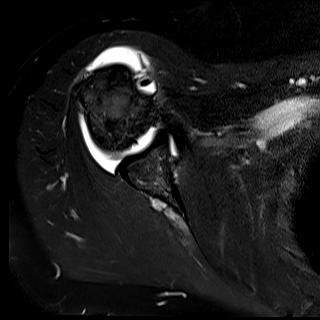
[im 14/26]
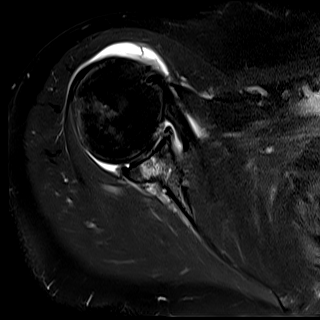
[im 17/26]
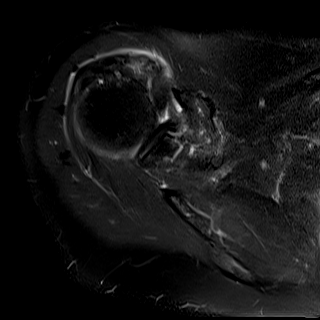
[im 23/26]
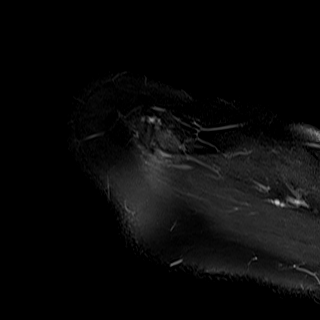
[im 26/26]
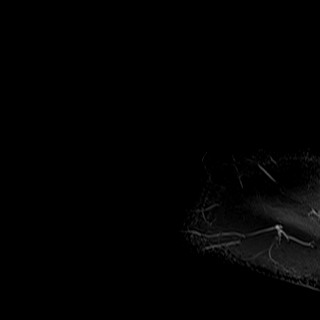

[Series 6: T2 fat-sat · coronal · right · 4.0mm · 0.23mm/px · 8 of 19 slices shown (2 of 3)]
[im 1/19]
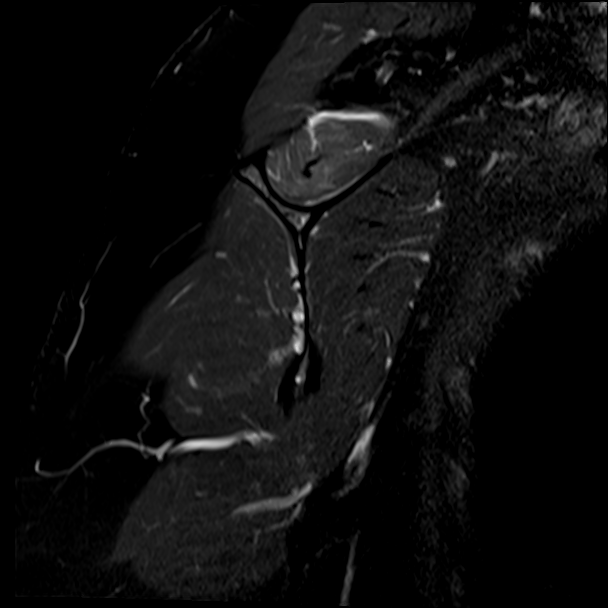
[im 3/19]
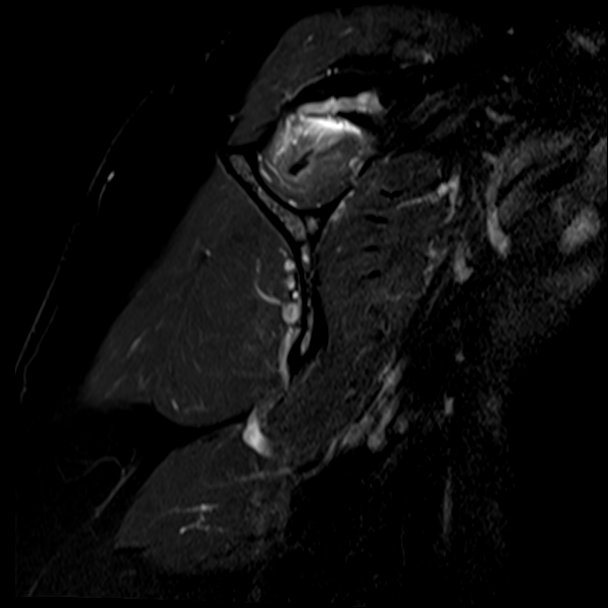
[im 6/19]
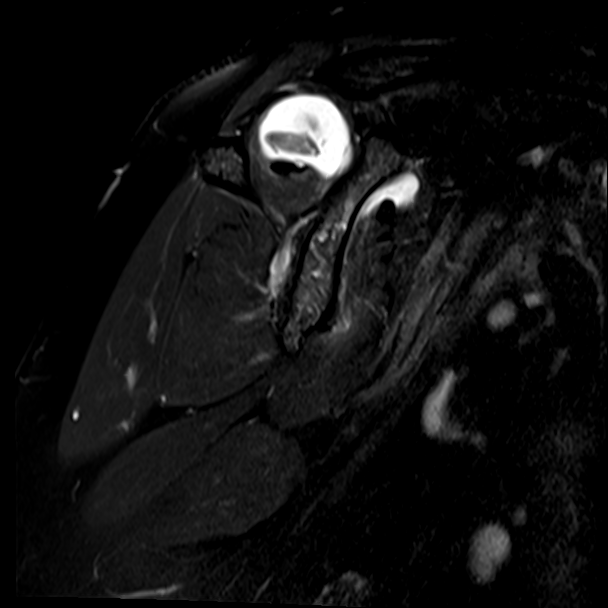
[im 8/19]
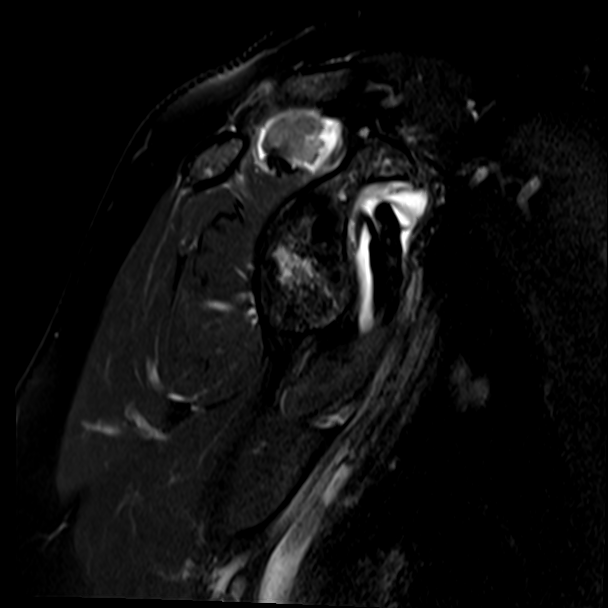
[im 11/19]
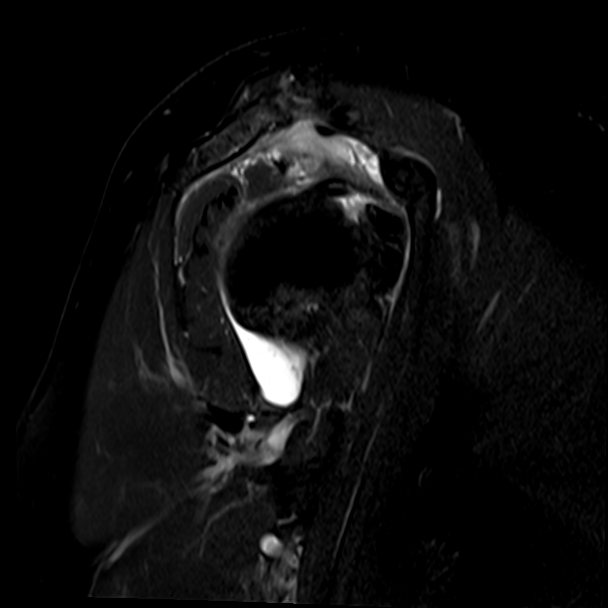
[im 13/19]
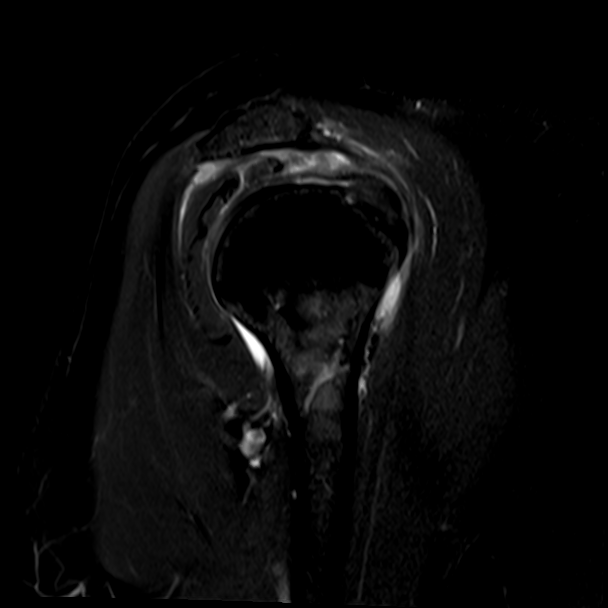
[im 16/19]
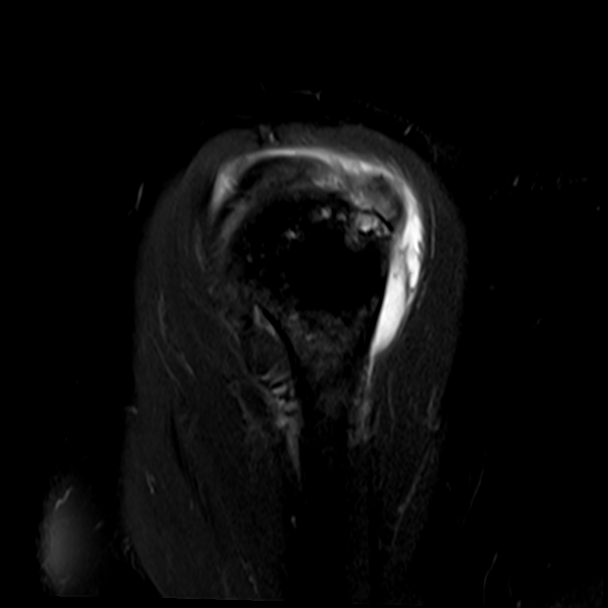
[im 19/19]
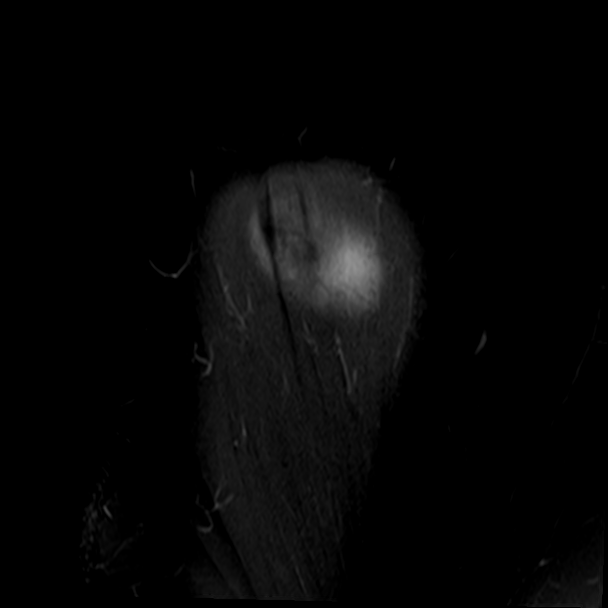

[Series 8: PD · oblique · right · 4.0mm · 0.44mm/px · 7 of 18 slices shown]
[im 1/18]
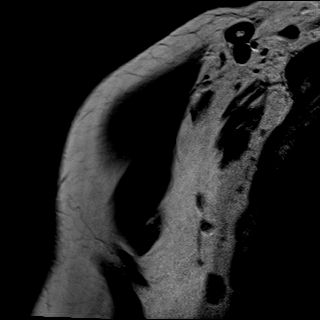
[im 3/18]
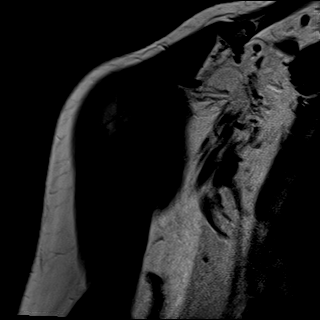
[im 6/18]
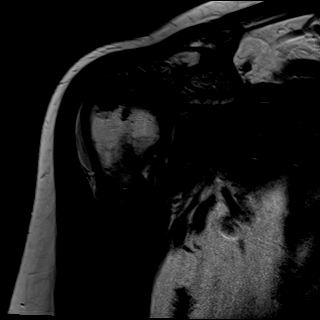
[im 9/18]
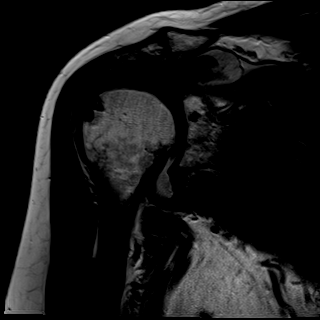
[im 12/18]
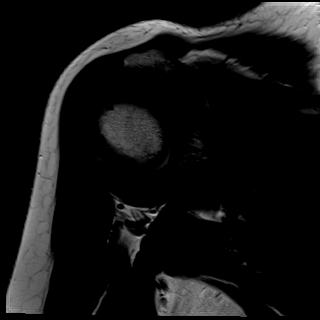
[im 15/18]
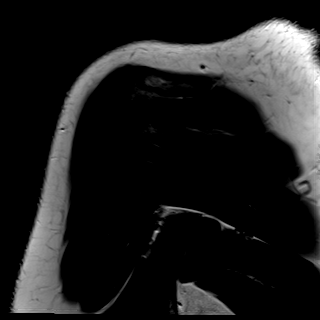
[im 18/18]
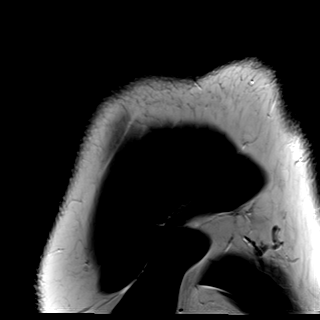

[Series 9: T2 fat-sat · oblique · right · 4.0mm · 0.44mm/px · 4 of 18 slices shown (3 of 3)]
[im 1/18]
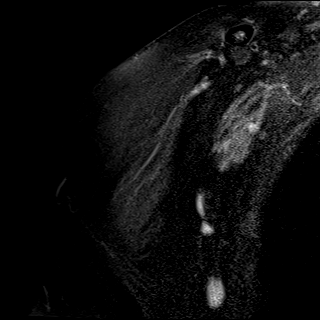
[im 3/18]
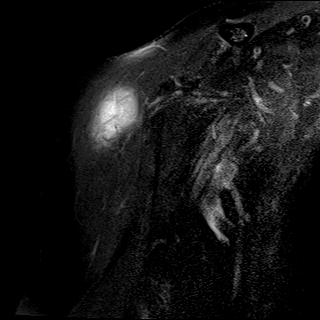
[im 9/18]
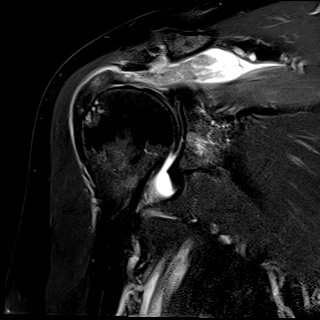
[im 15/18]
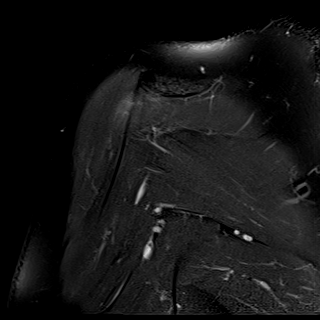

[27 of 40 positions shown; findings below may reference images not displayed]

FINDINGS: Rotator cuff: The patient has severe supraspinatus tendinopathy.
Debris containing fluid collection described below somewhat limits
evaluation but there appears to be a deep bursal sided tear of the
tendon in the critical zone measuring approximately 0.8 cm from
front to back with a gap between tendon fragments of 2-3 cm. There
is a milder degree of subscapularis and infraspinatus tendinopathy.

Muscles: A fluid collection superior to the supraspinatus contains a
large volume of debris and communicates with the
subacromial/subdeltoid bursa. Superior to the supraspinatus, the
collection measures 2 cm AP x 1.6 cm craniocaudal by approximately
5.5 cm transverse.

Biceps long head:  Intact.

Acromioclavicular Joint: Moderate osteoarthritis. Type 1 acromion.
Subacromial spur. Large volume of fluid in the
subacromial/subdeltoid bursa noted.

Glenohumeral Joint: The patient has moderately severe degenerative
change with marked cartilage loss and subchondral edema in the
glenoid. Small osteophyte off the humeral head is seen.

Labrum:  The superior and posterior labrum are severely degenerated.

Bones:  No fracture, contusion or worrisome lesion.

Other: None.
IMPRESSION: Rotator cuff tendinopathy is severe in the supraspinatus. There is
likely a deep bursal sided tear measuring 0.8 cm from front to back
with a gap between tendon fragments of 2-3 cm. Debris containing
fluid collection somewhat limits evaluation.

Large debris containing fluid collection superior to the
supraspinatus extending into the subacromial/subdeltoid bursa
compatible with severe bursitis.

Advanced glenohumeral and moderate acromioclavicular osteoarthritis.

Subacromial/subdeltoid bursitis.

## 2021-09-23 ENCOUNTER — Other Ambulatory Visit (HOSPITAL_COMMUNITY): Payer: Self-pay

## 2021-09-23 MED ORDER — OMEPRAZOLE 20 MG PO CPDR: 20.0000 mg | DELAYED_RELEASE_CAPSULE | Freq: Two times a day (BID) | ORAL | 0 refills | Status: AC

## 2021-09-29 ENCOUNTER — Other Ambulatory Visit (HOSPITAL_COMMUNITY): Payer: Self-pay

## 2021-09-29 MED ORDER — LOSARTAN POTASSIUM-HCTZ 50-12.5 MG PO TABS
1.0000 | ORAL_TABLET | Freq: Every day | ORAL | 1 refills | Status: DC
  Filled 2021-09-29: qty 90, 90d supply, fill #0
  Filled 2021-12-29: qty 90, 90d supply, fill #1

## 2021-12-03 DIAGNOSIS — K219 Gastro-esophageal reflux disease without esophagitis: Secondary | ICD-10-CM | POA: Diagnosis not present

## 2021-12-03 DIAGNOSIS — G8929 Other chronic pain: Secondary | ICD-10-CM | POA: Diagnosis not present

## 2021-12-03 DIAGNOSIS — M1990 Unspecified osteoarthritis, unspecified site: Secondary | ICD-10-CM | POA: Diagnosis not present

## 2021-12-03 DIAGNOSIS — E663 Overweight: Secondary | ICD-10-CM | POA: Diagnosis not present

## 2021-12-03 DIAGNOSIS — I10 Essential (primary) hypertension: Secondary | ICD-10-CM | POA: Diagnosis not present

## 2021-12-24 DIAGNOSIS — L821 Other seborrheic keratosis: Secondary | ICD-10-CM | POA: Diagnosis not present

## 2021-12-24 DIAGNOSIS — D2262 Melanocytic nevi of left upper limb, including shoulder: Secondary | ICD-10-CM | POA: Diagnosis not present

## 2021-12-24 DIAGNOSIS — D2261 Melanocytic nevi of right upper limb, including shoulder: Secondary | ICD-10-CM | POA: Diagnosis not present

## 2021-12-24 DIAGNOSIS — D225 Melanocytic nevi of trunk: Secondary | ICD-10-CM | POA: Diagnosis not present

## 2021-12-24 DIAGNOSIS — D2271 Melanocytic nevi of right lower limb, including hip: Secondary | ICD-10-CM | POA: Diagnosis not present

## 2021-12-24 DIAGNOSIS — L720 Epidermal cyst: Secondary | ICD-10-CM | POA: Diagnosis not present

## 2021-12-24 DIAGNOSIS — L290 Pruritus ani: Secondary | ICD-10-CM | POA: Diagnosis not present

## 2021-12-24 DIAGNOSIS — D2272 Melanocytic nevi of left lower limb, including hip: Secondary | ICD-10-CM | POA: Diagnosis not present

## 2021-12-29 ENCOUNTER — Other Ambulatory Visit (HOSPITAL_COMMUNITY): Payer: Self-pay

## 2022-01-19 DIAGNOSIS — I10 Essential (primary) hypertension: Secondary | ICD-10-CM | POA: Diagnosis not present

## 2022-01-19 DIAGNOSIS — R739 Hyperglycemia, unspecified: Secondary | ICD-10-CM | POA: Diagnosis not present

## 2022-01-19 DIAGNOSIS — Z79899 Other long term (current) drug therapy: Secondary | ICD-10-CM | POA: Diagnosis not present

## 2022-01-26 DIAGNOSIS — E7800 Pure hypercholesterolemia, unspecified: Secondary | ICD-10-CM | POA: Diagnosis not present

## 2022-01-26 DIAGNOSIS — K219 Gastro-esophageal reflux disease without esophagitis: Secondary | ICD-10-CM | POA: Diagnosis not present

## 2022-01-26 DIAGNOSIS — Z79899 Other long term (current) drug therapy: Secondary | ICD-10-CM | POA: Diagnosis not present

## 2022-01-26 DIAGNOSIS — R7303 Prediabetes: Secondary | ICD-10-CM | POA: Diagnosis not present

## 2022-01-26 DIAGNOSIS — I10 Essential (primary) hypertension: Secondary | ICD-10-CM | POA: Diagnosis not present

## 2022-03-16 DIAGNOSIS — Z961 Presence of intraocular lens: Secondary | ICD-10-CM | POA: Diagnosis not present

## 2022-03-25 ENCOUNTER — Other Ambulatory Visit (HOSPITAL_COMMUNITY): Payer: Self-pay

## 2022-03-25 MED ORDER — LOSARTAN POTASSIUM-HCTZ 50-12.5 MG PO TABS
1.0000 | ORAL_TABLET | Freq: Every day | ORAL | 1 refills | Status: AC
  Filled 2022-03-25: qty 90, 90d supply, fill #0
  Filled 2022-07-05: qty 90, 90d supply, fill #1

## 2022-06-15 ENCOUNTER — Encounter: Payer: Self-pay | Admitting: Family Medicine

## 2022-06-15 DIAGNOSIS — Z1231 Encounter for screening mammogram for malignant neoplasm of breast: Secondary | ICD-10-CM

## 2022-06-25 ENCOUNTER — Other Ambulatory Visit: Payer: Self-pay | Admitting: Family Medicine

## 2022-06-25 DIAGNOSIS — Z1231 Encounter for screening mammogram for malignant neoplasm of breast: Secondary | ICD-10-CM

## 2022-06-30 DIAGNOSIS — N390 Urinary tract infection, site not specified: Secondary | ICD-10-CM | POA: Diagnosis not present

## 2022-06-30 DIAGNOSIS — R300 Dysuria: Secondary | ICD-10-CM | POA: Diagnosis not present

## 2022-07-05 ENCOUNTER — Other Ambulatory Visit (HOSPITAL_COMMUNITY): Payer: Self-pay

## 2022-07-09 ENCOUNTER — Other Ambulatory Visit (HOSPITAL_COMMUNITY): Payer: Self-pay

## 2022-07-13 ENCOUNTER — Other Ambulatory Visit (HOSPITAL_COMMUNITY): Payer: Self-pay

## 2022-07-14 ENCOUNTER — Ambulatory Visit
Admission: RE | Admit: 2022-07-14 | Discharge: 2022-07-14 | Disposition: A | Discharge status: Home / Self Care | Payer: PPO | Source: Ambulatory Visit | Attending: Family Medicine | Admitting: Family Medicine

## 2022-07-14 DIAGNOSIS — Z1231 Encounter for screening mammogram for malignant neoplasm of breast: Secondary | ICD-10-CM | POA: Diagnosis not present

## 2022-07-28 DIAGNOSIS — Z79899 Other long term (current) drug therapy: Secondary | ICD-10-CM | POA: Diagnosis not present

## 2022-07-28 DIAGNOSIS — R7303 Prediabetes: Secondary | ICD-10-CM | POA: Diagnosis not present

## 2022-07-28 DIAGNOSIS — E7800 Pure hypercholesterolemia, unspecified: Secondary | ICD-10-CM | POA: Diagnosis not present

## 2022-08-03 DIAGNOSIS — Z0000 Encounter for general adult medical examination without abnormal findings: Secondary | ICD-10-CM | POA: Diagnosis not present

## 2022-08-03 DIAGNOSIS — E7800 Pure hypercholesterolemia, unspecified: Secondary | ICD-10-CM | POA: Diagnosis not present

## 2022-08-03 DIAGNOSIS — Z79899 Other long term (current) drug therapy: Secondary | ICD-10-CM | POA: Diagnosis not present

## 2022-08-03 DIAGNOSIS — Z780 Asymptomatic menopausal state: Secondary | ICD-10-CM | POA: Diagnosis not present

## 2022-08-03 DIAGNOSIS — R7303 Prediabetes: Secondary | ICD-10-CM | POA: Diagnosis not present

## 2022-08-03 DIAGNOSIS — Z1331 Encounter for screening for depression: Secondary | ICD-10-CM | POA: Diagnosis not present

## 2022-08-03 DIAGNOSIS — I10 Essential (primary) hypertension: Secondary | ICD-10-CM | POA: Diagnosis not present

## 2022-08-11 DIAGNOSIS — M8588 Other specified disorders of bone density and structure, other site: Secondary | ICD-10-CM | POA: Diagnosis not present

## 2022-11-01 DIAGNOSIS — N390 Urinary tract infection, site not specified: Secondary | ICD-10-CM | POA: Diagnosis not present

## 2022-11-01 DIAGNOSIS — R300 Dysuria: Secondary | ICD-10-CM | POA: Diagnosis not present

## 2022-12-09 DIAGNOSIS — Z87440 Personal history of urinary (tract) infections: Secondary | ICD-10-CM | POA: Diagnosis not present

## 2022-12-09 DIAGNOSIS — R300 Dysuria: Secondary | ICD-10-CM | POA: Diagnosis not present

## 2022-12-09 DIAGNOSIS — N390 Urinary tract infection, site not specified: Secondary | ICD-10-CM | POA: Diagnosis not present

## 2023-01-12 DIAGNOSIS — L290 Pruritus ani: Secondary | ICD-10-CM | POA: Diagnosis not present

## 2023-01-12 DIAGNOSIS — D225 Melanocytic nevi of trunk: Secondary | ICD-10-CM | POA: Diagnosis not present

## 2023-01-12 DIAGNOSIS — L821 Other seborrheic keratosis: Secondary | ICD-10-CM | POA: Diagnosis not present

## 2023-01-26 DIAGNOSIS — E7800 Pure hypercholesterolemia, unspecified: Secondary | ICD-10-CM | POA: Diagnosis not present

## 2023-01-26 DIAGNOSIS — R7303 Prediabetes: Secondary | ICD-10-CM | POA: Diagnosis not present

## 2023-01-26 DIAGNOSIS — Z79899 Other long term (current) drug therapy: Secondary | ICD-10-CM | POA: Diagnosis not present

## 2023-01-28 DIAGNOSIS — I10 Essential (primary) hypertension: Secondary | ICD-10-CM | POA: Diagnosis not present

## 2023-01-28 DIAGNOSIS — U071 COVID-19: Secondary | ICD-10-CM | POA: Diagnosis not present

## 2023-02-01 DIAGNOSIS — Z79899 Other long term (current) drug therapy: Secondary | ICD-10-CM | POA: Diagnosis not present

## 2023-02-01 DIAGNOSIS — K219 Gastro-esophageal reflux disease without esophagitis: Secondary | ICD-10-CM | POA: Diagnosis not present

## 2023-02-01 DIAGNOSIS — I10 Essential (primary) hypertension: Secondary | ICD-10-CM | POA: Diagnosis not present

## 2023-02-01 DIAGNOSIS — E7800 Pure hypercholesterolemia, unspecified: Secondary | ICD-10-CM | POA: Diagnosis not present

## 2023-02-01 DIAGNOSIS — R7303 Prediabetes: Secondary | ICD-10-CM | POA: Diagnosis not present

## 2023-03-03 DIAGNOSIS — R300 Dysuria: Secondary | ICD-10-CM | POA: Diagnosis not present

## 2023-03-03 DIAGNOSIS — R8290 Unspecified abnormal findings in urine: Secondary | ICD-10-CM | POA: Diagnosis not present

## 2023-04-05 DIAGNOSIS — Z961 Presence of intraocular lens: Secondary | ICD-10-CM | POA: Diagnosis not present

## 2023-04-20 DIAGNOSIS — M25561 Pain in right knee: Secondary | ICD-10-CM | POA: Diagnosis not present

## 2023-04-20 DIAGNOSIS — M1711 Unilateral primary osteoarthritis, right knee: Secondary | ICD-10-CM | POA: Diagnosis not present

## 2023-04-20 DIAGNOSIS — M7051 Other bursitis of knee, right knee: Secondary | ICD-10-CM | POA: Diagnosis not present

## 2023-05-03 DIAGNOSIS — M7050 Other bursitis of knee, unspecified knee: Secondary | ICD-10-CM | POA: Diagnosis not present

## 2023-05-17 DIAGNOSIS — M7050 Other bursitis of knee, unspecified knee: Secondary | ICD-10-CM | POA: Diagnosis not present

## 2023-05-24 DIAGNOSIS — M7050 Other bursitis of knee, unspecified knee: Secondary | ICD-10-CM | POA: Diagnosis not present

## 2023-05-26 DIAGNOSIS — M7050 Other bursitis of knee, unspecified knee: Secondary | ICD-10-CM | POA: Diagnosis not present

## 2023-05-31 DIAGNOSIS — M7050 Other bursitis of knee, unspecified knee: Secondary | ICD-10-CM | POA: Diagnosis not present

## 2023-06-13 DIAGNOSIS — M7050 Other bursitis of knee, unspecified knee: Secondary | ICD-10-CM | POA: Diagnosis not present

## 2023-07-29 ENCOUNTER — Other Ambulatory Visit: Payer: Self-pay | Admitting: Family Medicine

## 2023-07-29 DIAGNOSIS — Z1231 Encounter for screening mammogram for malignant neoplasm of breast: Secondary | ICD-10-CM

## 2023-08-03 DIAGNOSIS — E7800 Pure hypercholesterolemia, unspecified: Secondary | ICD-10-CM | POA: Diagnosis not present

## 2023-08-03 DIAGNOSIS — Z79899 Other long term (current) drug therapy: Secondary | ICD-10-CM | POA: Diagnosis not present

## 2023-08-03 DIAGNOSIS — R7303 Prediabetes: Secondary | ICD-10-CM | POA: Diagnosis not present

## 2023-08-04 ENCOUNTER — Ambulatory Visit
Admission: RE | Admit: 2023-08-04 | Discharge: 2023-08-04 | Disposition: A | Discharge status: Home / Self Care | Payer: HMO | Source: Ambulatory Visit | Attending: Family Medicine | Admitting: Family Medicine

## 2023-08-04 DIAGNOSIS — Z1231 Encounter for screening mammogram for malignant neoplasm of breast: Secondary | ICD-10-CM | POA: Insufficient documentation

## 2023-08-09 DIAGNOSIS — Z0000 Encounter for general adult medical examination without abnormal findings: Secondary | ICD-10-CM | POA: Diagnosis not present

## 2023-08-09 DIAGNOSIS — Z79899 Other long term (current) drug therapy: Secondary | ICD-10-CM | POA: Diagnosis not present

## 2023-08-09 DIAGNOSIS — I10 Essential (primary) hypertension: Secondary | ICD-10-CM | POA: Diagnosis not present

## 2023-08-09 DIAGNOSIS — K219 Gastro-esophageal reflux disease without esophagitis: Secondary | ICD-10-CM | POA: Diagnosis not present

## 2023-08-09 DIAGNOSIS — R7303 Prediabetes: Secondary | ICD-10-CM | POA: Diagnosis not present

## 2024-01-20 DIAGNOSIS — L308 Other specified dermatitis: Secondary | ICD-10-CM | POA: Diagnosis not present

## 2024-01-20 DIAGNOSIS — L720 Epidermal cyst: Secondary | ICD-10-CM | POA: Diagnosis not present

## 2024-01-20 DIAGNOSIS — L290 Pruritus ani: Secondary | ICD-10-CM | POA: Diagnosis not present

## 2024-02-07 DIAGNOSIS — I10 Essential (primary) hypertension: Secondary | ICD-10-CM | POA: Diagnosis not present

## 2024-02-07 DIAGNOSIS — R7303 Prediabetes: Secondary | ICD-10-CM | POA: Diagnosis not present

## 2024-02-07 DIAGNOSIS — Z79899 Other long term (current) drug therapy: Secondary | ICD-10-CM | POA: Diagnosis not present

## 2024-02-14 DIAGNOSIS — R7303 Prediabetes: Secondary | ICD-10-CM | POA: Diagnosis not present

## 2024-02-14 DIAGNOSIS — K219 Gastro-esophageal reflux disease without esophagitis: Secondary | ICD-10-CM | POA: Diagnosis not present

## 2024-02-14 DIAGNOSIS — I10 Essential (primary) hypertension: Secondary | ICD-10-CM | POA: Diagnosis not present

## 2024-02-14 DIAGNOSIS — G4719 Other hypersomnia: Secondary | ICD-10-CM | POA: Diagnosis not present

## 2024-02-14 DIAGNOSIS — Z79899 Other long term (current) drug therapy: Secondary | ICD-10-CM | POA: Diagnosis not present

## 2024-02-14 DIAGNOSIS — G4710 Hypersomnia, unspecified: Secondary | ICD-10-CM | POA: Diagnosis not present

## 2024-03-20 DIAGNOSIS — H43811 Vitreous degeneration, right eye: Secondary | ICD-10-CM | POA: Diagnosis not present

## 2024-04-17 DIAGNOSIS — Z961 Presence of intraocular lens: Secondary | ICD-10-CM | POA: Diagnosis not present

## 2024-07-06 ENCOUNTER — Other Ambulatory Visit: Payer: Self-pay | Admitting: Surgery

## 2024-07-06 DIAGNOSIS — M75121 Complete rotator cuff tear or rupture of right shoulder, not specified as traumatic: Secondary | ICD-10-CM

## 2024-07-06 DIAGNOSIS — M7581 Other shoulder lesions, right shoulder: Secondary | ICD-10-CM

## 2024-07-06 DIAGNOSIS — M19011 Primary osteoarthritis, right shoulder: Secondary | ICD-10-CM

## 2024-07-11 ENCOUNTER — Ambulatory Visit
Admission: RE | Admit: 2024-07-11 | Discharge: 2024-07-11 | Disposition: A | Discharge status: Home / Self Care | Source: Ambulatory Visit | Attending: Surgery | Admitting: Surgery

## 2024-07-11 DIAGNOSIS — M75121 Complete rotator cuff tear or rupture of right shoulder, not specified as traumatic: Secondary | ICD-10-CM | POA: Insufficient documentation

## 2024-07-11 DIAGNOSIS — M19011 Primary osteoarthritis, right shoulder: Secondary | ICD-10-CM | POA: Insufficient documentation

## 2024-07-11 DIAGNOSIS — M7581 Other shoulder lesions, right shoulder: Secondary | ICD-10-CM | POA: Insufficient documentation
